# Patient Record
Sex: Female | Born: 2000 | Race: Black or African American | Hispanic: No | Marital: Single | Smoking: Never smoker
Health system: Southern US, Community
[De-identification: ages and names within clinical notes are randomized; demographics above are authoritative.]

---
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
## 2019-05-17 IMAGING — CR DG ANKLE COMPLETE 3+V*L*
3 series · 3 of 3 positions shown · non-contrast
Comparison: None.

CLINICAL DATA: 16-year-old female with fall and left ankle pain.

EXAM:
LEFT ANKLE COMPLETE - 3+ VIEW

[x ankle ap left]
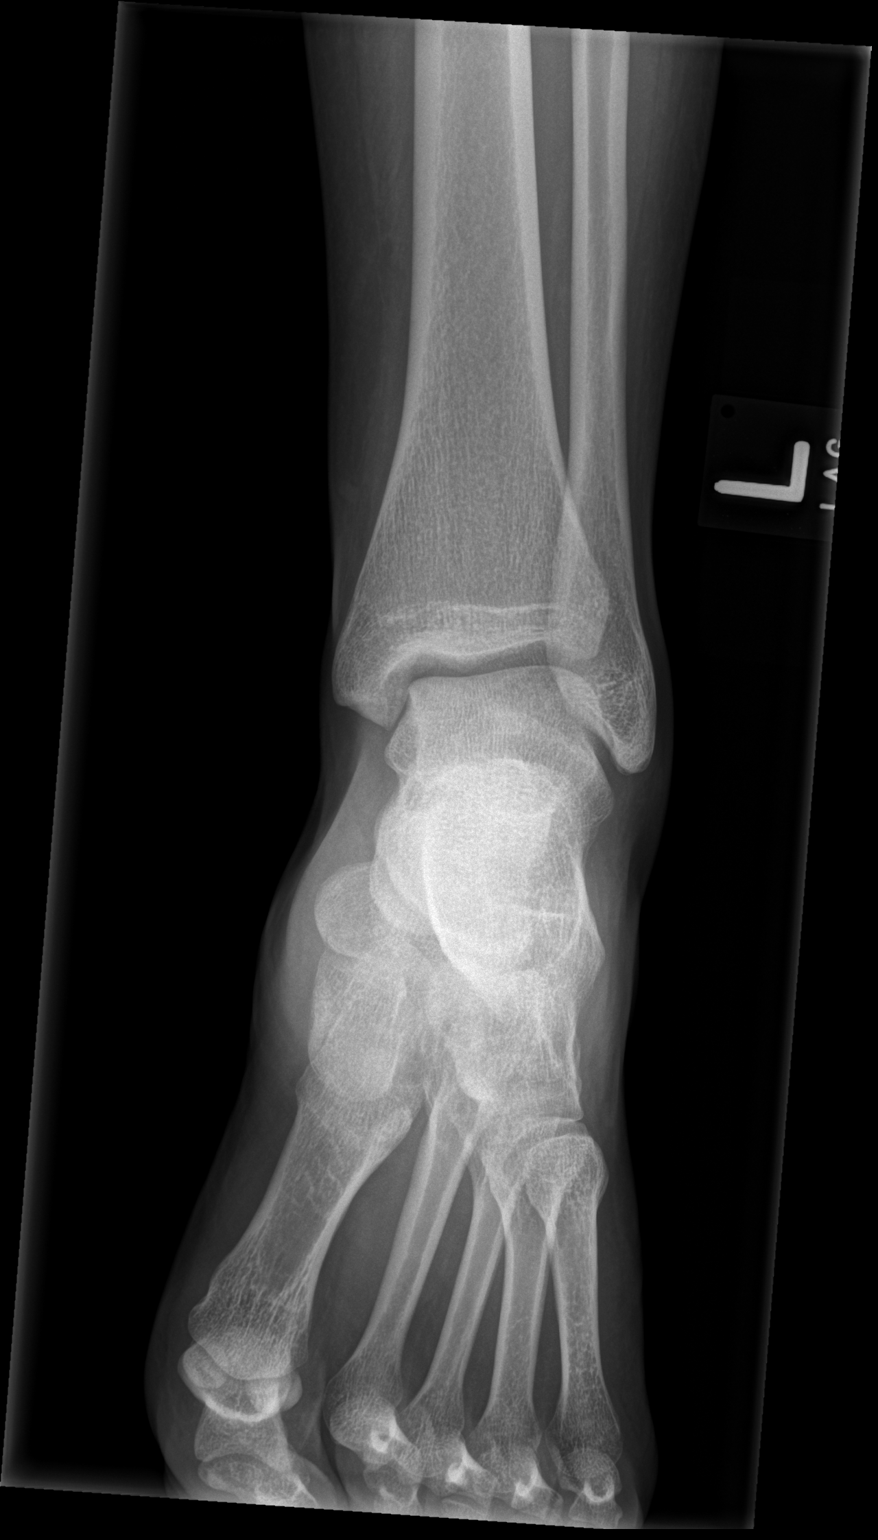

[x ankle obl left]
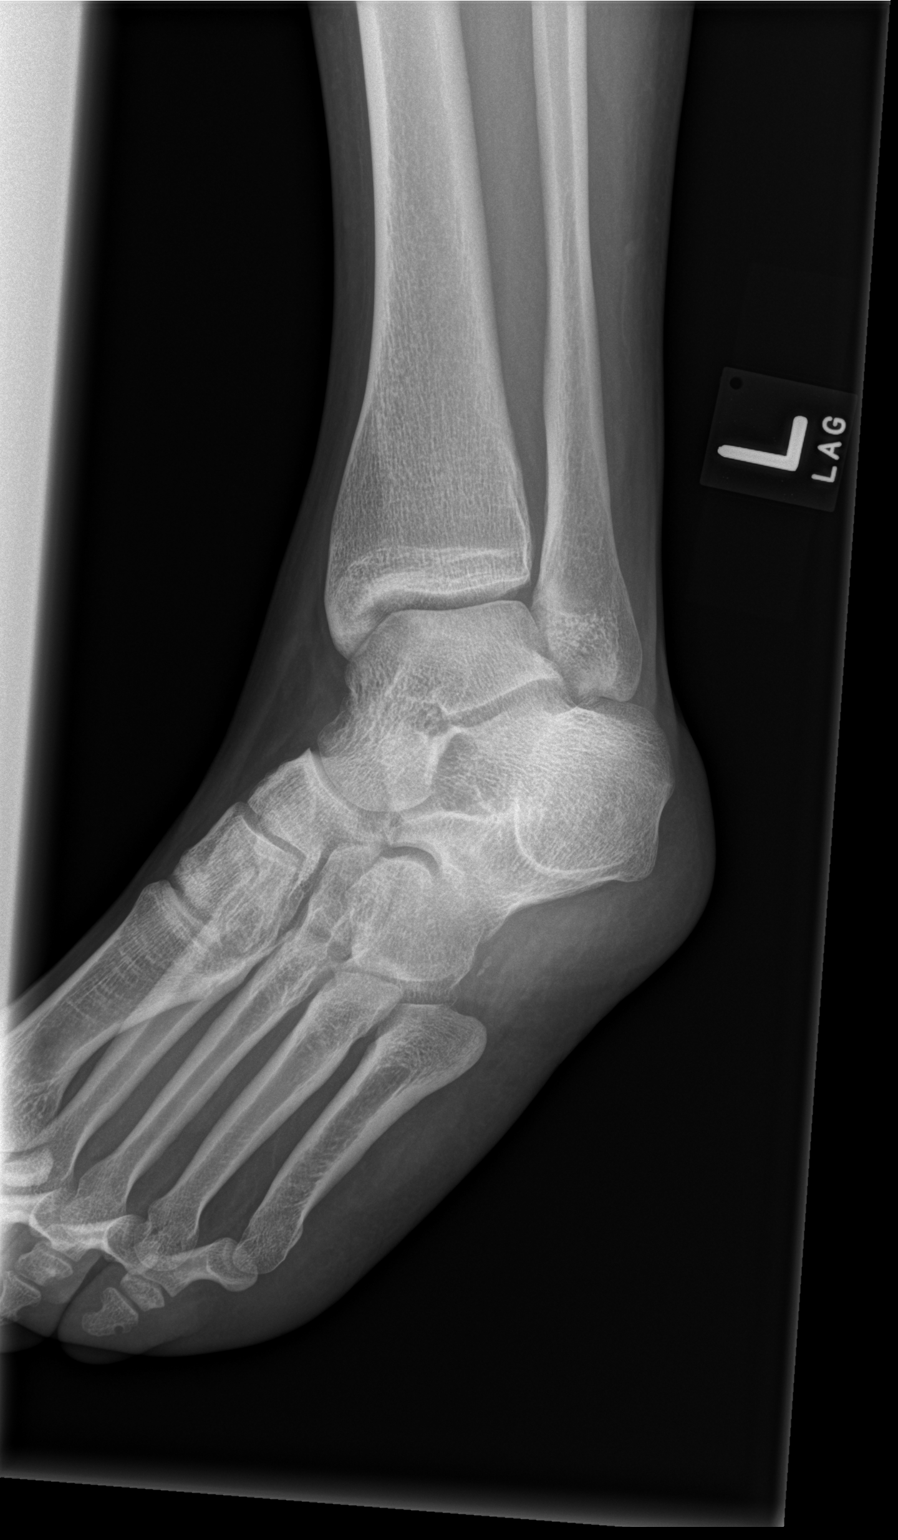

[x ankle lat left]
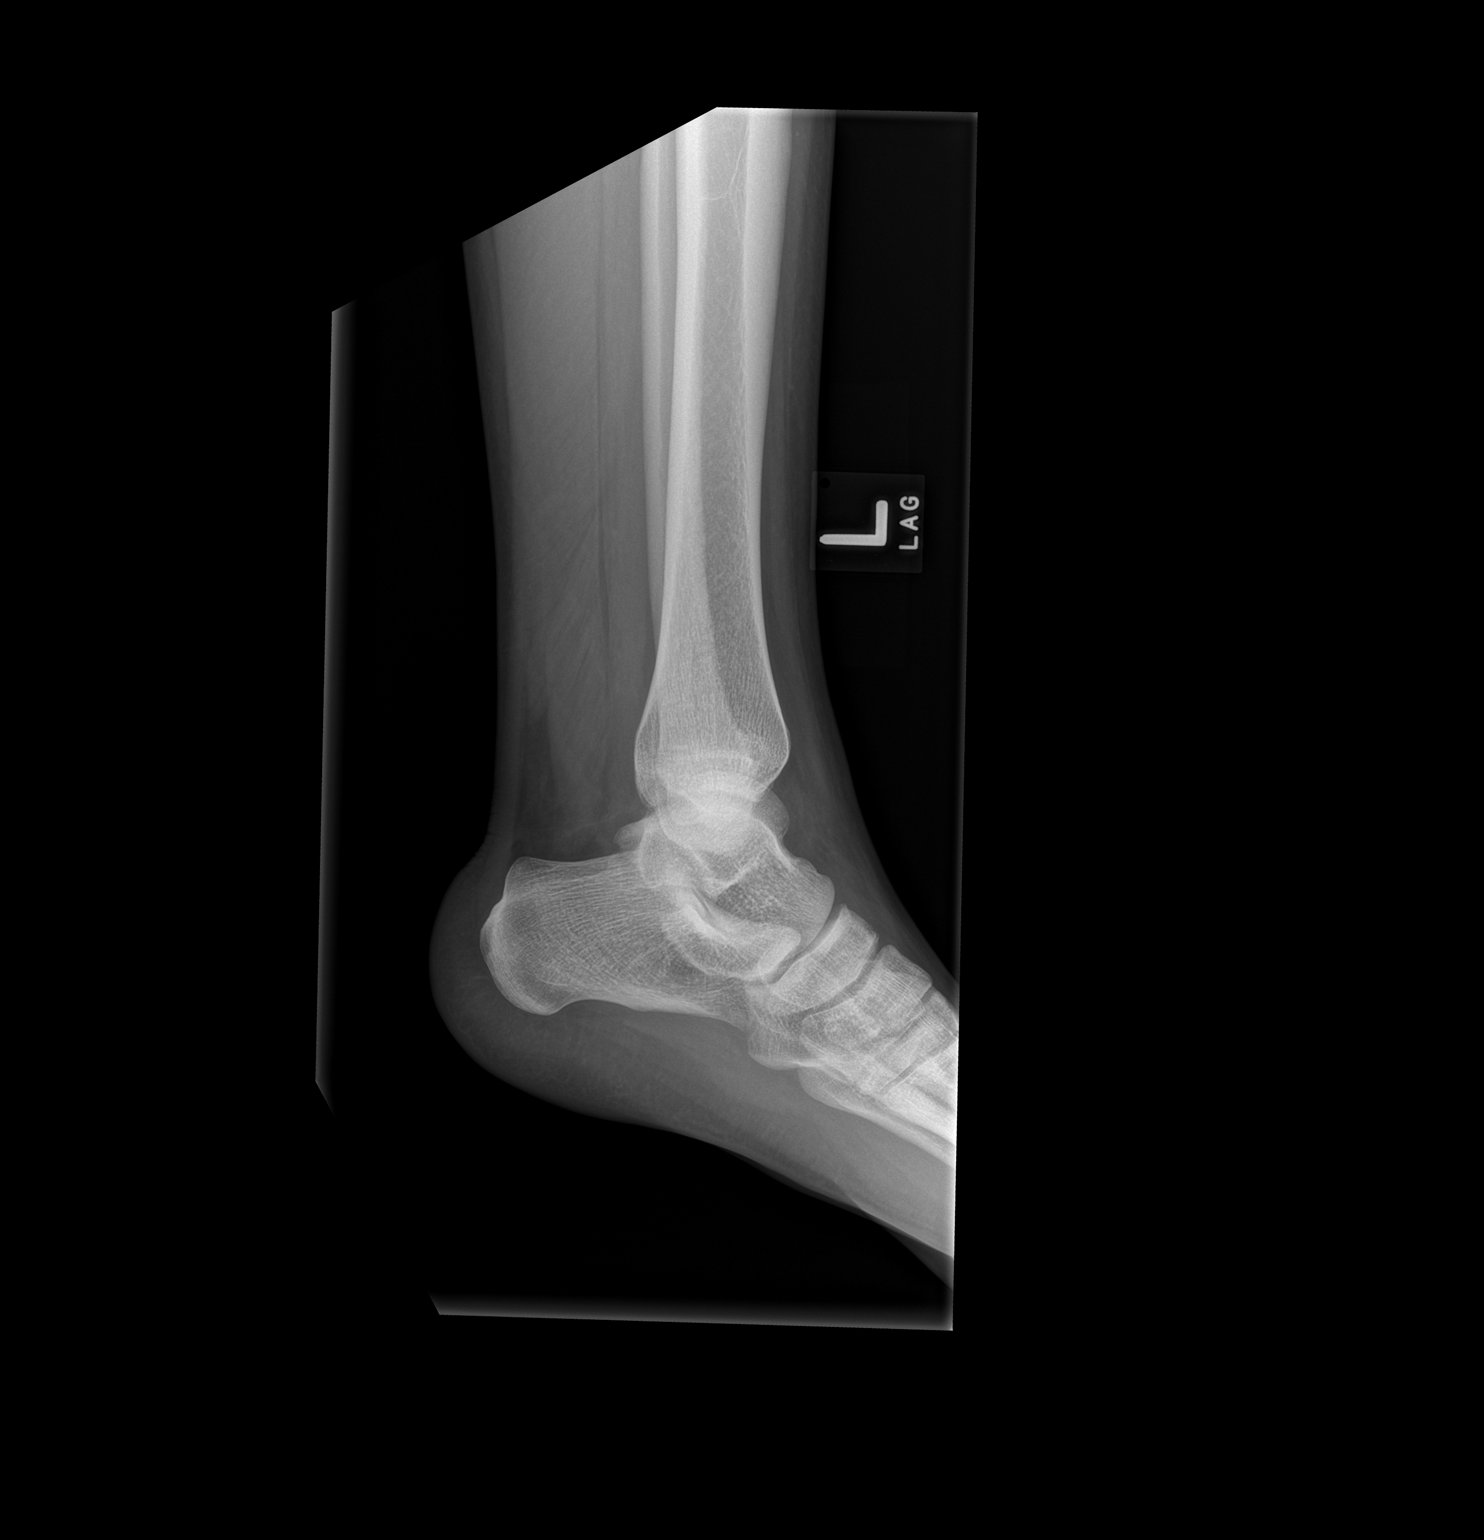

[3 of 3 positions shown; findings below may reference images not displayed]

FINDINGS: There is no evidence of fracture, dislocation, or joint effusion.
There is no evidence of arthropathy or other focal bone abnormality.
Soft tissues are unremarkable.
IMPRESSION: Negative.
